# Patient Record
Sex: Female | Born: 1935 | Race: White | Hispanic: No | Marital: Married | Smoking: Never smoker
Health system: Southern US, Community
[De-identification: ages and names within clinical notes are randomized; demographics above are authoritative.]

## PROBLEM LIST (undated history)

## (undated) DIAGNOSIS — I10 Essential (primary) hypertension: Secondary | ICD-10-CM

## (undated) HISTORY — PX: TONSILLECTOMY: SUR1361

## (undated) HISTORY — PX: APPENDECTOMY: SHX54

---

## 2013-09-23 ENCOUNTER — Emergency Department (HOSPITAL_COMMUNITY)
Admission: EM | Admit: 2013-09-23 | Discharge: 2013-09-23 | Disposition: A | Discharge status: Home / Self Care | Payer: Medicare Other | Attending: Emergency Medicine | Admitting: Emergency Medicine

## 2013-09-23 ENCOUNTER — Encounter (HOSPITAL_COMMUNITY): Payer: Self-pay | Admitting: Emergency Medicine

## 2013-09-23 ENCOUNTER — Emergency Department (HOSPITAL_COMMUNITY): Payer: Medicare Other

## 2013-09-23 DIAGNOSIS — T148XXA Other injury of unspecified body region, initial encounter: Secondary | ICD-10-CM

## 2013-09-23 DIAGNOSIS — I10 Essential (primary) hypertension: Secondary | ICD-10-CM | POA: Insufficient documentation

## 2013-09-23 DIAGNOSIS — S81809A Unspecified open wound, unspecified lower leg, initial encounter: Secondary | ICD-10-CM

## 2013-09-23 DIAGNOSIS — S199XXA Unspecified injury of neck, initial encounter: Secondary | ICD-10-CM

## 2013-09-23 DIAGNOSIS — S0993XA Unspecified injury of face, initial encounter: Secondary | ICD-10-CM | POA: Insufficient documentation

## 2013-09-23 DIAGNOSIS — Z880 Allergy status to penicillin: Secondary | ICD-10-CM | POA: Insufficient documentation

## 2013-09-23 DIAGNOSIS — Y92009 Unspecified place in unspecified non-institutional (private) residence as the place of occurrence of the external cause: Secondary | ICD-10-CM | POA: Insufficient documentation

## 2013-09-23 DIAGNOSIS — Z7982 Long term (current) use of aspirin: Secondary | ICD-10-CM | POA: Insufficient documentation

## 2013-09-23 DIAGNOSIS — W19XXXA Unspecified fall, initial encounter: Secondary | ICD-10-CM

## 2013-09-23 DIAGNOSIS — W1809XA Striking against other object with subsequent fall, initial encounter: Secondary | ICD-10-CM | POA: Insufficient documentation

## 2013-09-23 DIAGNOSIS — Z79899 Other long term (current) drug therapy: Secondary | ICD-10-CM | POA: Insufficient documentation

## 2013-09-23 DIAGNOSIS — G8929 Other chronic pain: Secondary | ICD-10-CM | POA: Insufficient documentation

## 2013-09-23 DIAGNOSIS — S060X1A Concussion with loss of consciousness of 30 minutes or less, initial encounter: Secondary | ICD-10-CM | POA: Insufficient documentation

## 2013-09-23 DIAGNOSIS — S81009A Unspecified open wound, unspecified knee, initial encounter: Secondary | ICD-10-CM | POA: Insufficient documentation

## 2013-09-23 DIAGNOSIS — S51809A Unspecified open wound of unspecified forearm, initial encounter: Secondary | ICD-10-CM | POA: Insufficient documentation

## 2013-09-23 DIAGNOSIS — S91009A Unspecified open wound, unspecified ankle, initial encounter: Secondary | ICD-10-CM

## 2013-09-23 DIAGNOSIS — Y9301 Activity, walking, marching and hiking: Secondary | ICD-10-CM | POA: Insufficient documentation

## 2013-09-23 DIAGNOSIS — Z23 Encounter for immunization: Secondary | ICD-10-CM | POA: Insufficient documentation

## 2013-09-23 HISTORY — DX: Essential (primary) hypertension: I10

## 2013-09-23 MED ORDER — TETANUS-DIPHTH-ACELL PERTUSSIS 5-2.5-18.5 LF-MCG/0.5 IM SUSP
0.5000 mL | Freq: Once | INTRAMUSCULAR | Status: AC
  Administered 2013-09-23: 0.5 mL via INTRAMUSCULAR
  Filled 2013-09-23: qty 0.5

## 2013-09-23 NOTE — ED Notes (Signed)
PA at bedside.

## 2013-09-23 NOTE — Discharge Instructions (Signed)
Continue all home meds as directed. Return to the ED for new concerns.

## 2013-09-23 NOTE — ED Notes (Signed)
Per EMS pt fell attempting to go to the bathroom; skin tear to left leg and left arm; Pt hit back of head; denies neck or back pain; family states pt LOC for a couple of minutes; CBG 112 no n/v or blurred vision. Pt c/o leg, arm, and back of head pain.

## 2013-09-23 NOTE — ED Notes (Signed)
Patient transported to CT 

## 2013-09-23 NOTE — ED Provider Notes (Signed)
CSN: [id number]     Arrival date & time 09/23/13  0536 History   First MD Initiated Contact with Patient 09/23/13 0557     Chief Complaint  Patient presents with  . Fall     (Consider location/radiation/quality/duration/timing/severity/associated sxs/prior Treatment) Patient is a 80 y.o. female presenting with fall. The history is provided by the patient and medical records.  Fall Associated symptoms include arthralgias.   [name] with past history significant for hypertension, presenting to the ED via EMS for a fall at home. Patient states she woke up and was walking to the bathroom in the dark, stumbled and fell into the bathtub. She did hit her head and did experience LOC for approximately 2-3 minutes per family.  Currently she denies headache or dizziness.  Patient has skin tears to her left forearm and left shin. She denies pain at these areas. She complains of chronic neck pain, but states he does still somewhat worse today. She denies any numbness or paresthesias of extremities. She denies any chest pain or back pain. Patient is on daily aspirin, no other anticoagulants.  Pt walks unassisted at baseline.  On arrival, pt AAOx3, VS stable.  Past Medical History  Diagnosis Date  . Hypertension    Past Surgical History  Procedure Laterality Date  . Appendectomy    . Tonsillectomy     History reviewed. No pertinent family history. History  Substance Use Topics  . Smoking status: Never Smoker   . Smokeless tobacco: Not on file  . Alcohol Use: No   OB History   Grav Para Term Preterm Abortions TAB SAB Ect Mult Living                 Review of Systems  Musculoskeletal: Positive for arthralgias.  All other systems reviewed and are negative.      Allergies  Atenolol; Celebrex; Codeine; Doxycycline; Evista; Keflex; Levaquin; Lisinopril; Losartan; Morphine and related; Norvasc; Paroxetine; Penicillins; Percocet; Tamiflu; and Vicodin  Home Medications    Current Outpatient Rx  Name  Route  Sig  Dispense  Refill  . aspirin 81 MG chewable tablet   Oral   Chew 81 mg by mouth daily.         . cyanocobalamin 2000 MCG tablet   Oral   Take 2,000 mcg by mouth daily.         Marland Kitchen estradiol (ESTRACE) 0.1 MG/GM vaginal cream   Vaginal   Place 1 Applicatorful vaginally 3 (three) times a week.         . hydrALAZINE (APRESOLINE) 25 MG tablet   Oral   Take 25 mg by mouth 2 (two) times daily.         Marland Kitchen lactobacillus acidophilus (BACID) TABS tablet   Oral   Take 1 tablet by mouth daily.         Marland Kitchen LORazepam (ATIVAN) 1 MG tablet   Oral   Take 1 mg by mouth at bedtime as needed for anxiety.         . magnesium hydroxide (MILK OF MAGNESIA) 400 MG/5ML suspension   Oral   Take 30 mLs by mouth 2 (two) times daily as needed for mild constipation.         . metoprolol succinate (TOPROL-XL) 25 MG 24 hr tablet   Oral   Take 25 mg by mouth daily.         . pantoprazole (PROTONIX) 40 MG tablet   Oral   Take 40 mg  by mouth daily.          BP 141/77  Pulse 134  Temp(Src) 97.6 F (36.4 C) (Oral)  Resp 23  SpO2 94%  Physical Exam  Nursing note and vitals reviewed. Constitutional: She is oriented to person, place, and time. She appears well-developed and well-nourished. No distress.  HENT:  Head: Normocephalic and atraumatic. Head is without raccoon's eyes, without Battle's sign, without abrasion, without contusion and without laceration.  Mouth/Throat: Oropharynx is clear and moist.  Eyes: Conjunctivae and EOM are normal. Pupils are equal, round, and reactive to light.  Neck: Normal range of motion. Neck supple.  Cardiovascular: Normal rate, regular rhythm and normal heart sounds.   Pulmonary/Chest: Effort normal and breath sounds normal. No respiratory distress. She has no wheezes.  Musculoskeletal: Normal range of motion.       Cervical back: She exhibits tenderness, bony tenderness and pain.  Bilateral hips nontender  without gross deformities, no leg shortening; full ROM of both hips without pain; distal pulses intact; sensation intact diffusely throughout BLE Left forearm and left shin with skin tears; no TTP or deformities; strong pulses; sensation intact Diffuse TTP of cervical spine without step-off or deformities; full ROM maintained with some pain  Neurological: She is alert and oriented to person, place, and time.  AAOx3, answering questions appropriately; equal strength UE and LE bilaterally; CN grossly intact; moves all extremities appropriately without ataxia; no focal neuro deficits or facial asymmetry appreciated  Skin: Skin is warm and dry. She is not diaphoretic.  Skin tears of left forearm and left shin; no active bleeding; no FB or signs of infection  Psychiatric: She has a normal mood and affect.    ED Course  Procedures (including critical care time) Labs Review Labs Reviewed - No data to display Imaging Review Dg Pelvis 1-2 Views  09/23/2013   CLINICAL DATA:  Fall  EXAM: PELVIS - 1-2 VIEW  COMPARISON:  None.  FINDINGS: There is no evidence of pelvic fracture or diastasis. No other pelvic bone lesions are seen.  IMPRESSION: Negative.   Electronically Signed   By: Marlan Palau M.D.   On: 09/23/2013 06:53   Dg Forearm Left  09/23/2013   CLINICAL DATA:  Fall  EXAM: LEFT FOREARM - 2 VIEW  COMPARISON:  None.  FINDINGS: There is no evidence of fracture or other focal bone lesions. Soft tissues are unremarkable. Mild degenerative change in the elbow joint with spurring.  IMPRESSION: Negative for fracture.   Electronically Signed   By: Marlan Palau M.D.   On: 09/23/2013 06:56   Dg Tibia/fibula Left  09/23/2013   CLINICAL DATA:  Fall  EXAM: LEFT TIBIA AND FIBULA - 2 VIEW  COMPARISON:  None.  FINDINGS: There is no evidence of fracture or other focal bone lesions. Soft tissues are unremarkable.  IMPRESSION: Negative.   Electronically Signed   By: Marlan Palau M.D.   On: 09/23/2013 06:54   Ct  Head Wo Contrast  09/23/2013   CLINICAL DATA:  Fall, posterior head trauma.  Loss of consciousness.  EXAM: CT HEAD WITHOUT CONTRAST  CT CERVICAL SPINE WITHOUT CONTRAST  TECHNIQUE: Multidetector CT imaging of the head and cervical spine was performed following the standard protocol without intravenous contrast. Multiplanar CT image reconstructions of the cervical spine were also generated.  COMPARISON:  None available for comparison at time of study interpretation.  FINDINGS: CT HEAD FINDINGS  The ventricles and sulci are normal for age. No intraparenchymal hemorrhage, mass effect nor  midline shift. Patchy to confluent supratentorial white matter hypodensities are within normal range for patient's age and though non-specific suggest sequelae of chronic small vessel ischemic disease. No acute large vascular territory infarcts.  No abnormal extra-axial fluid collections. Basal cisterns are patent. Minimal calcific atherosclerosis of the carotid siphons.  No skull fracture. Mild right maxillary mucosal thickening, no paranasal sinus air-fluid levels. Tiny left maxillary mucosal retention cyst. Mastoid air cells appear well-aerated. . The included ocular globes and orbital contents are non-suspicious. Status post bilateral ocular lens implants.  CT CERVICAL SPINE FINDINGS  Cervical vertebral bodies and posterior elements are intact with maintenance of the cervical lordosis. Grade 1 C6-7 anterolisthesis on degenerative basis. Severe C3-4, moderate to severe C4-5, moderate C5-6 degenerative disc disease. C1-2 articulation maintained with mild arthropathy. No destructive bony lesions. Mild broad levoscoliosis. Mild calcific atherosclerosis of the carotid bulbs.  Degenerative disc disease and facet arthropathy without canal stenosis. Severe right C3-4, moderate to severe right C4-5 and C5-6 neural foraminal narrowing.  IMPRESSION: CT head:  No acute intracranial process.  Involutional changes, moderate white matter changes  suggest chronic small vessel ischemic disease.  CT cervical spine: No acute cervical spine fracture. Grade 1 C6-7 anterolisthesis on degenerative basis.   Electronically Signed   By: Awilda Metroourtnay  Bloomer   On: 09/23/2013 06:40   Ct Cervical Spine Wo Contrast  09/23/2013   CLINICAL DATA:  Fall, posterior head trauma.  Loss of consciousness.  EXAM: CT HEAD WITHOUT CONTRAST  CT CERVICAL SPINE WITHOUT CONTRAST  TECHNIQUE: Multidetector CT imaging of the head and cervical spine was performed following the standard protocol without intravenous contrast. Multiplanar CT image reconstructions of the cervical spine were also generated.  COMPARISON:  None available for comparison at time of study interpretation.  FINDINGS: CT HEAD FINDINGS  The ventricles and sulci are normal for age. No intraparenchymal hemorrhage, mass effect nor midline shift. Patchy to confluent supratentorial white matter hypodensities are within normal range for patient's age and though non-specific suggest sequelae of chronic small vessel ischemic disease. No acute large vascular territory infarcts.  No abnormal extra-axial fluid collections. Basal cisterns are patent. Minimal calcific atherosclerosis of the carotid siphons.  No skull fracture. Mild right maxillary mucosal thickening, no paranasal sinus air-fluid levels. Tiny left maxillary mucosal retention cyst. Mastoid air cells appear well-aerated. . The included ocular globes and orbital contents are non-suspicious. Status post bilateral ocular lens implants.  CT CERVICAL SPINE FINDINGS  Cervical vertebral bodies and posterior elements are intact with maintenance of the cervical lordosis. Grade 1 C6-7 anterolisthesis on degenerative basis. Severe C3-4, moderate to severe C4-5, moderate C5-6 degenerative disc disease. C1-2 articulation maintained with mild arthropathy. No destructive bony lesions. Mild broad levoscoliosis. Mild calcific atherosclerosis of the carotid bulbs.  Degenerative disc  disease and facet arthropathy without canal stenosis. Severe right C3-4, moderate to severe right C4-5 and C5-6 neural foraminal narrowing.  IMPRESSION: CT head:  No acute intracranial process.  Involutional changes, moderate white matter changes suggest chronic small vessel ischemic disease.  CT cervical spine: No acute cervical spine fracture. Grade 1 C6-7 anterolisthesis on degenerative basis.   Electronically Signed   By: Awilda Metroourtnay  Bloomer   On: 09/23/2013 06:40     EKG Interpretation None      MDM   Final diagnoses:  Fall  Multiple skin tears   80 year old female status post fall. Neuro exam is intact. She has no head laceration. She has multiple skin tears of her extremities.  Will obtain CT  head/neck, films of left forearm, tib/fib, and pelvis.  Imaging negative for acute findings.  Tetanus updated, wounds cleansed and dressed.  Pt will FU with PCP if problems occur.  Discussed plan with pt and family at bedside, all acknowledged understanding and agreed with plan of care.  Garlon Hatchet, PA-C 09/23/13 1032

## 2013-09-24 NOTE — ED Provider Notes (Signed)
Medical screening examination/treatment/procedure(s) were performed by non-physician practitioner and as supervising physician I was immediately available for consultation/collaboration.   Saidee Geremia, MD 09/24/13 0427 

## 2015-06-06 IMAGING — CR DG PELVIS 1-2V
1 series · 1 of 1 positions shown · non-contrast
Comparison: None.

CLINICAL DATA: Fall

EXAM:
PELVIS - 1-2 VIEW

[t pelvis ap]
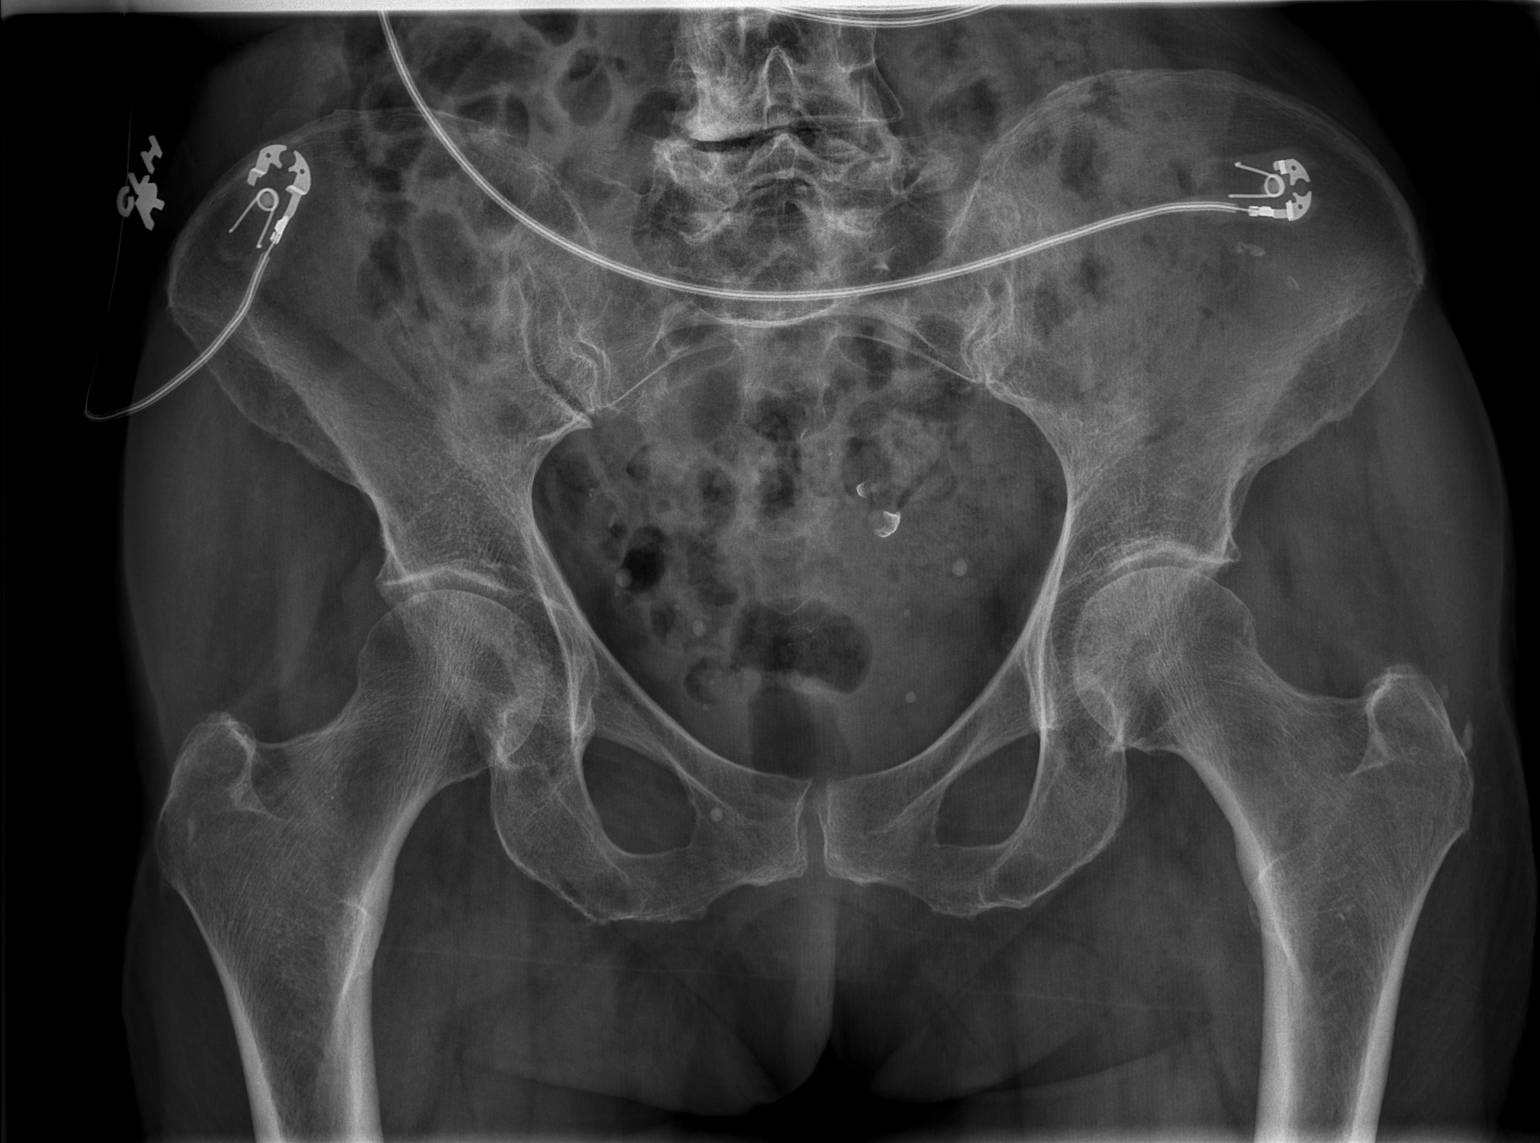

[1 of 1 positions shown; findings below may reference images not displayed]

FINDINGS: There is no evidence of pelvic fracture or diastasis. No other
pelvic bone lesions are seen.
IMPRESSION: Negative.

## 2015-06-06 IMAGING — CR DG TIBIA/FIBULA 2V*L*
4 series · 4 of 4 positions shown · non-contrast
Comparison: None.

CLINICAL DATA: Fall

EXAM:
LEFT TIBIA AND FIBULA - 2 VIEW

[t tib-fib ap left (1 of 2)]
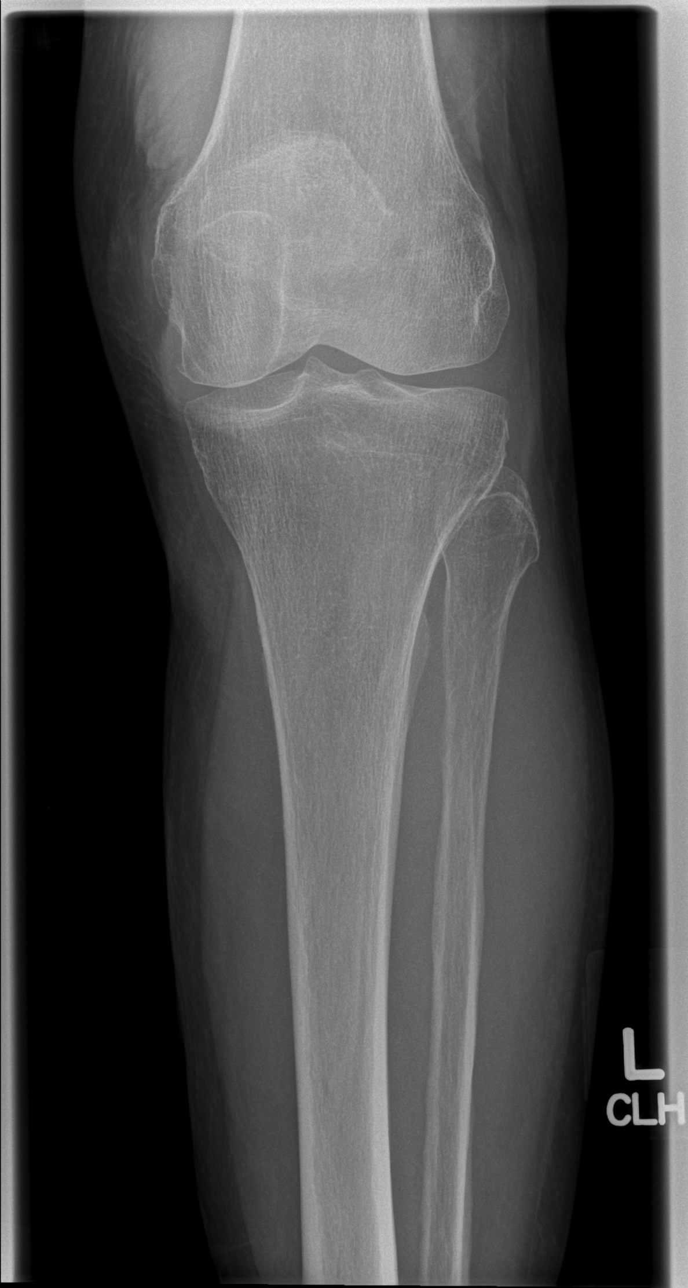

[t tib-fib ap left (2 of 2)]
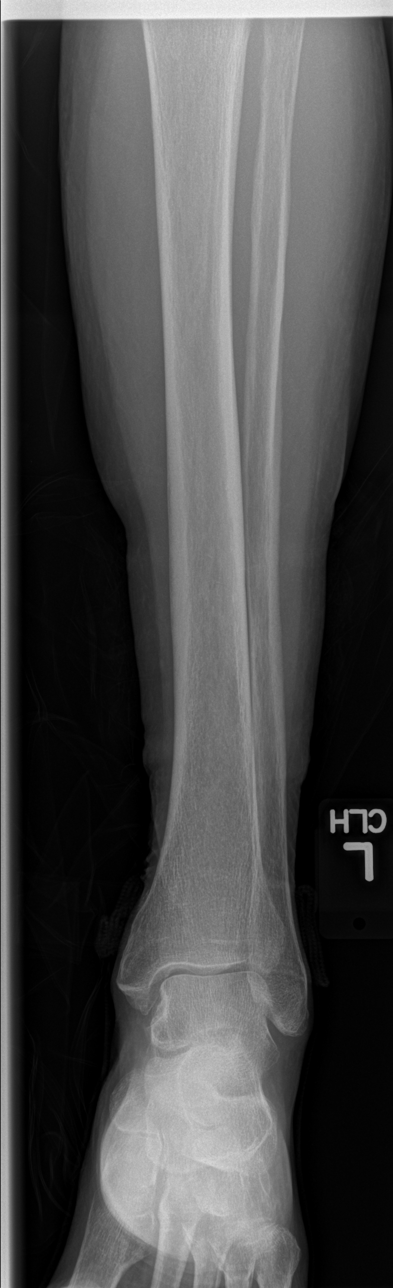

[t tib-fib lat left (1 of 2)]
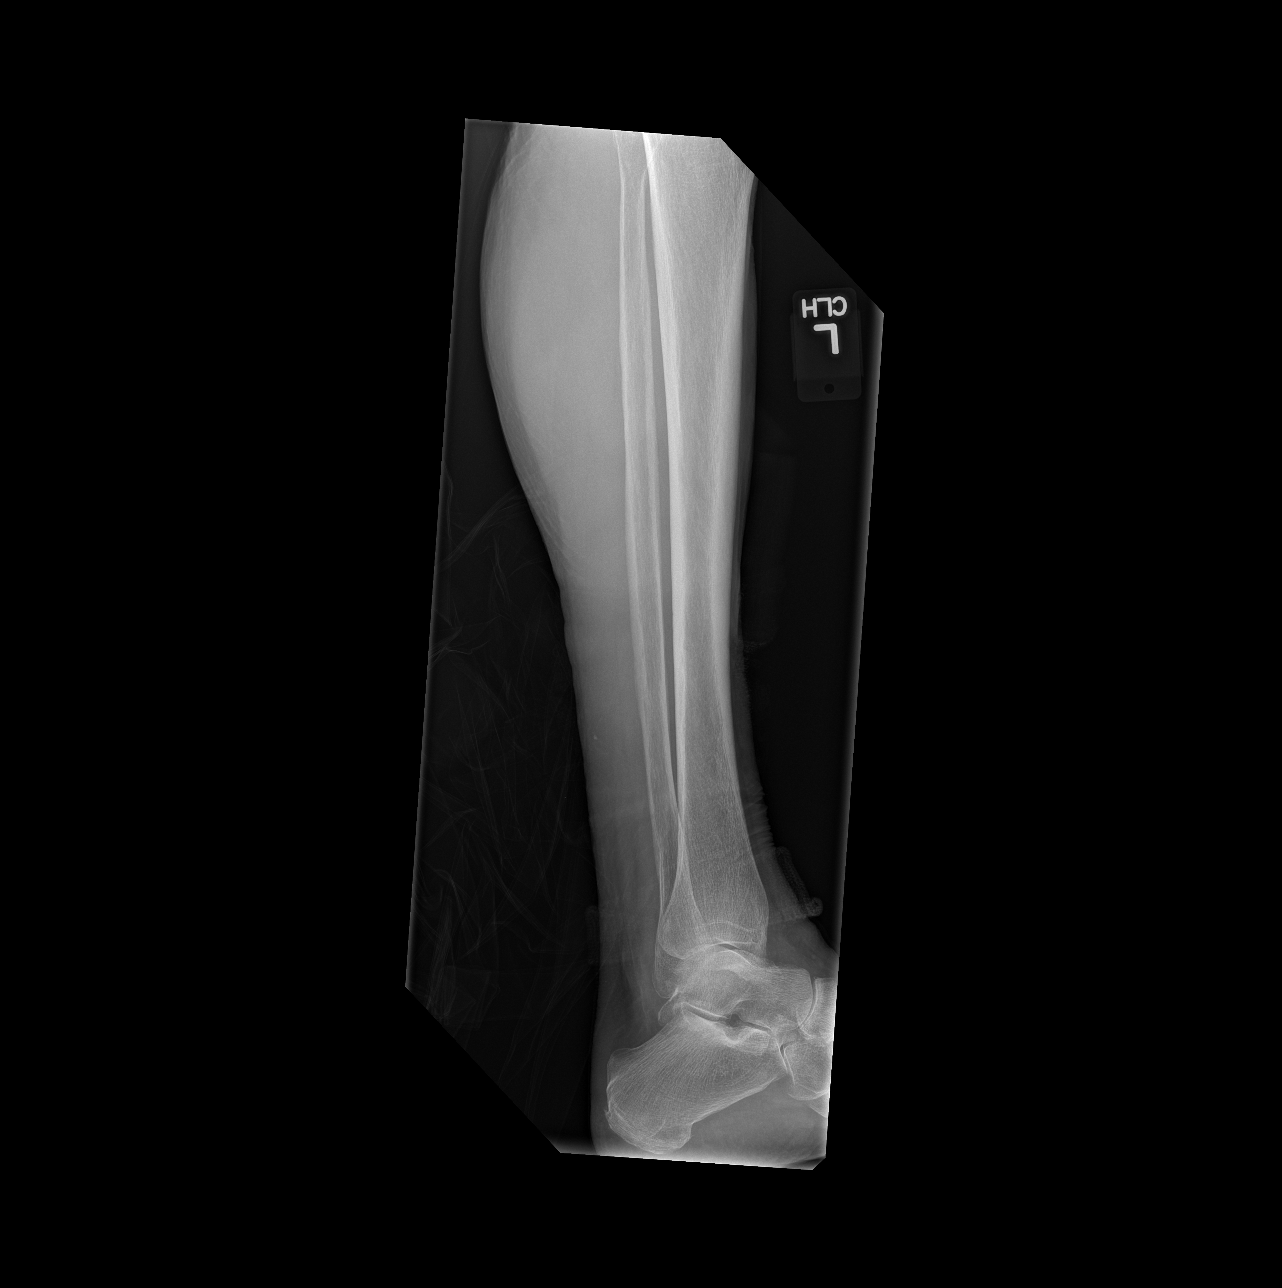

[t tib-fib lat left (2 of 2)]
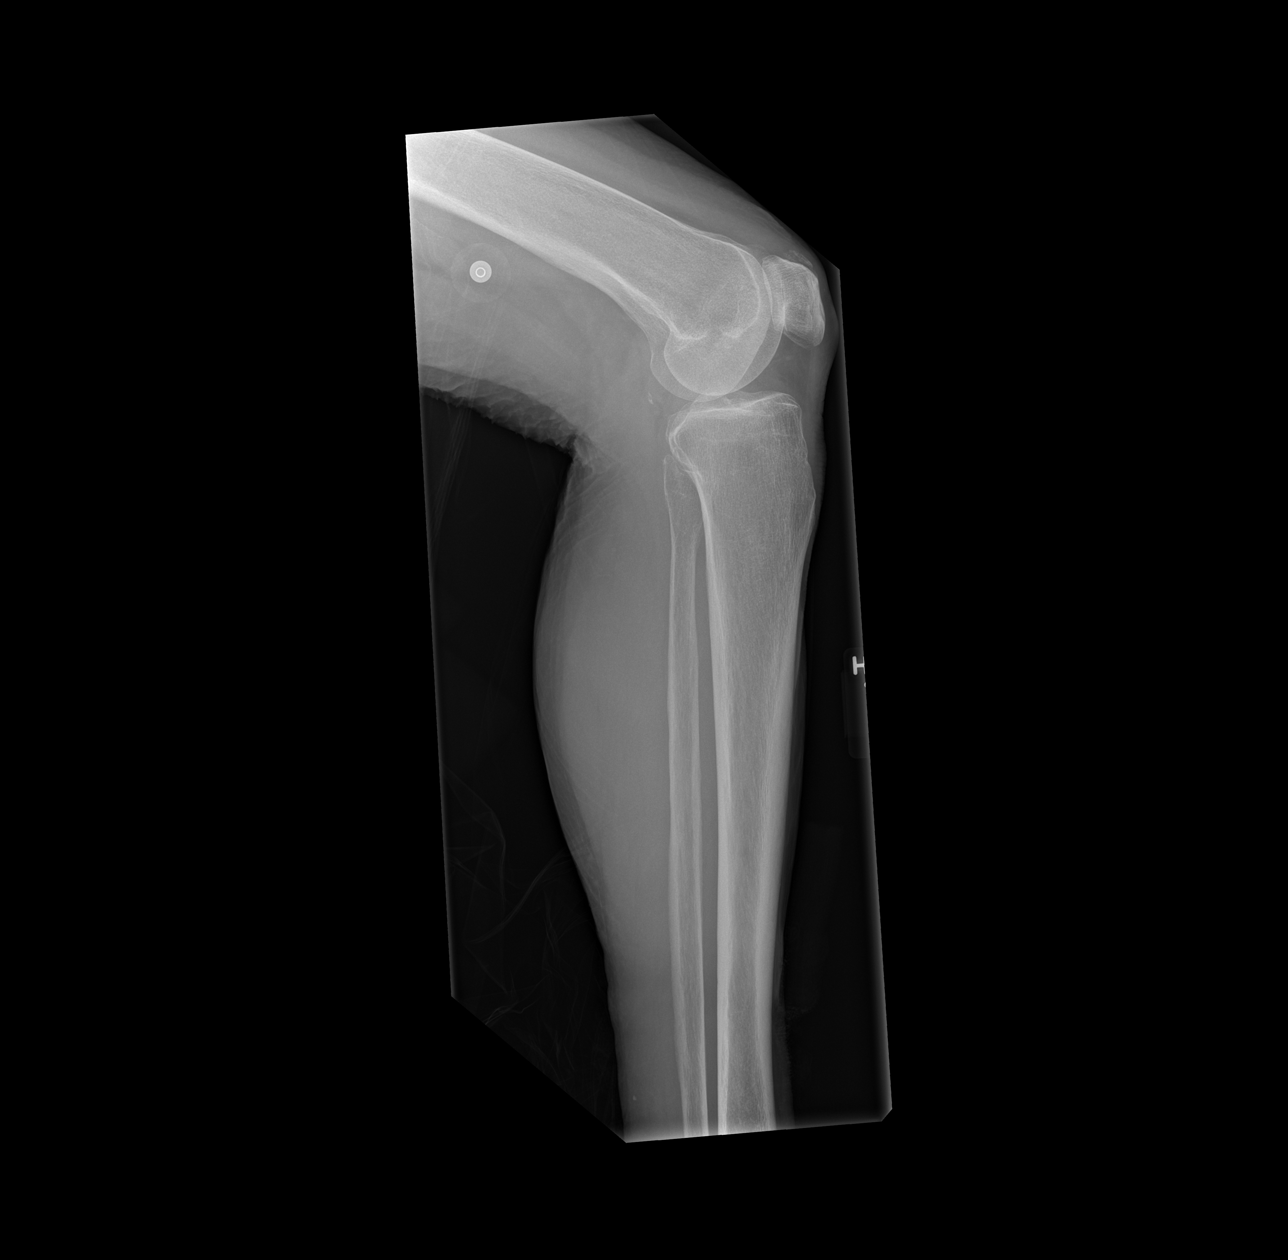

[4 of 4 positions shown; findings below may reference images not displayed]

FINDINGS: There is no evidence of fracture or other focal bone lesions. Soft
tissues are unremarkable.
IMPRESSION: Negative.

## 2016-02-06 ENCOUNTER — Ambulatory Visit: Admission: RE | Admit: 2016-02-06 | Payer: Medicare Other | Source: Ambulatory Visit

## 2016-02-06 ENCOUNTER — Other Ambulatory Visit: Payer: Self-pay | Admitting: Nurse Practitioner

## 2016-02-06 DIAGNOSIS — J40 Bronchitis, not specified as acute or chronic: Secondary | ICD-10-CM

## 2019-05-14 DEATH — deceased
